# Patient Record
Sex: Male | Born: 1960 | Race: White | Hispanic: No | Marital: Married | Smoking: Never smoker
Health system: Southern US, Community
[De-identification: ages and names within clinical notes are randomized; demographics above are authoritative.]

## PROBLEM LIST (undated history)

## (undated) DIAGNOSIS — R011 Cardiac murmur, unspecified: Secondary | ICD-10-CM

## (undated) DIAGNOSIS — E079 Disorder of thyroid, unspecified: Secondary | ICD-10-CM

## (undated) HISTORY — DX: Disorder of thyroid, unspecified: E07.9

## (undated) HISTORY — PX: COLONOSCOPY: SHX174

## (undated) HISTORY — DX: Cardiac murmur, unspecified: R01.1

---

## 2002-10-19 ENCOUNTER — Emergency Department (HOSPITAL_COMMUNITY): Admission: EM | Admit: 2002-10-19 | Discharge: 2002-10-19 | Payer: Self-pay | Admitting: Emergency Medicine

## 2002-10-19 ENCOUNTER — Encounter: Payer: Self-pay | Admitting: Emergency Medicine

## 2007-01-04 ENCOUNTER — Encounter: Payer: Self-pay | Admitting: Internal Medicine

## 2007-01-12 ENCOUNTER — Ambulatory Visit: Payer: Self-pay | Admitting: Internal Medicine

## 2007-01-12 DIAGNOSIS — E041 Nontoxic single thyroid nodule: Secondary | ICD-10-CM

## 2007-01-17 LAB — CONVERTED CEMR LAB
Basophils Absolute: 0.2 10*3/uL — ABNORMAL HIGH (ref 0.0–0.1)
Basophils Relative: 2.7 % — ABNORMAL HIGH (ref 0.0–1.0)
Free T4: 0.8 ng/dL (ref 0.6–1.6)
MCHC: 34.5 g/dL (ref 30.0–36.0)
Monocytes Relative: 4.5 % (ref 3.0–11.0)
Neutrophils Relative %: 53.1 % (ref 43.0–77.0)
Platelets: 217 10*3/uL (ref 150–400)
RBC: 4.46 M/uL (ref 4.22–5.81)
RDW: 12.2 % (ref 11.5–14.6)

## 2007-01-24 ENCOUNTER — Encounter: Admission: RE | Admit: 2007-01-24 | Discharge: 2007-01-24 | Payer: Self-pay | Admitting: Internal Medicine

## 2007-02-08 ENCOUNTER — Ambulatory Visit: Payer: Self-pay | Admitting: Gastroenterology

## 2007-02-09 ENCOUNTER — Telehealth (INDEPENDENT_AMBULATORY_CARE_PROVIDER_SITE_OTHER): Payer: Self-pay | Admitting: *Deleted

## 2007-02-15 ENCOUNTER — Encounter: Payer: Self-pay | Admitting: Internal Medicine

## 2007-02-15 ENCOUNTER — Encounter (INDEPENDENT_AMBULATORY_CARE_PROVIDER_SITE_OTHER): Payer: Self-pay | Admitting: Interventional Radiology

## 2007-02-15 ENCOUNTER — Other Ambulatory Visit: Admission: RE | Admit: 2007-02-15 | Discharge: 2007-02-15 | Payer: Self-pay | Admitting: Interventional Radiology

## 2007-02-15 ENCOUNTER — Encounter: Admission: RE | Admit: 2007-02-15 | Discharge: 2007-02-15 | Payer: Self-pay | Admitting: Internal Medicine

## 2007-02-21 ENCOUNTER — Telehealth (INDEPENDENT_AMBULATORY_CARE_PROVIDER_SITE_OTHER): Payer: Self-pay | Admitting: *Deleted

## 2007-02-22 ENCOUNTER — Ambulatory Visit: Payer: Self-pay | Admitting: Gastroenterology

## 2007-02-22 ENCOUNTER — Encounter: Payer: Self-pay | Admitting: Internal Medicine

## 2007-03-23 ENCOUNTER — Ambulatory Visit: Payer: Self-pay | Admitting: Internal Medicine

## 2009-07-15 ENCOUNTER — Ambulatory Visit: Payer: Self-pay | Admitting: Internal Medicine

## 2010-03-03 ENCOUNTER — Ambulatory Visit
Admission: RE | Admit: 2010-03-03 | Discharge: 2010-03-03 | Payer: Self-pay | Source: Home / Self Care | Attending: Internal Medicine | Admitting: Internal Medicine

## 2010-03-07 ENCOUNTER — Ambulatory Visit
Admission: RE | Admit: 2010-03-07 | Discharge: 2010-03-07 | Payer: Self-pay | Source: Home / Self Care | Attending: Internal Medicine | Admitting: Internal Medicine

## 2010-03-07 ENCOUNTER — Ambulatory Visit: Admit: 2010-03-07 | Payer: Self-pay | Admitting: Internal Medicine

## 2010-03-09 LAB — CONVERTED CEMR LAB
Alkaline Phosphatase: 48 units/L (ref 39–117)
Basophils Relative: 2.5 % (ref 0.0–3.0)
CO2: 30 meq/L (ref 19–32)
Calcium: 9.4 mg/dL (ref 8.4–10.5)
Chloride: 105 meq/L (ref 96–112)
Cholesterol: 201 mg/dL — ABNORMAL HIGH (ref 0–200)
Creatinine, Ser: 0.9 mg/dL (ref 0.4–1.5)
Direct LDL: 118.6 mg/dL
Eosinophils Absolute: 0.2 10*3/uL (ref 0.0–0.7)
Eosinophils Relative: 4.4 % (ref 0.0–5.0)
GFR calc non Af Amer: 95.02 mL/min (ref >60)
Glucose, Bld: 78 mg/dL (ref 70–99)
HCT: 39.1 % (ref 39.0–52.0)
HDL: 46.5 mg/dL (ref >39.00)
Hemoglobin: 13.6 g/dL (ref 13.0–17.0)
Lymphs Abs: 2 10*3/uL (ref 0.7–4.0)
Neutro Abs: 1.1 10*3/uL — ABNORMAL LOW (ref 1.4–7.7)
Potassium: 4.1 meq/L (ref 3.5–5.1)
RBC: 4.34 M/uL (ref 4.22–5.81)
Sodium: 142 meq/L (ref 135–145)
TSH: 0.67 microintl units/mL (ref 0.35–5.50)
Total Protein: 6.6 g/dL (ref 6.0–8.3)
VLDL: 34 mg/dL (ref 0.0–40.0)

## 2010-03-11 NOTE — Assessment & Plan Note (Signed)
Summary: CPX  AND FASTING LABS??///SPH   Vital Signs:  Patient profile:   50 year old male Height:      72 inches Weight:      217 pounds BMI:     29.54 Temp:     98.5 degrees F oral Pulse rate:   72 / minute Resp:     16 per minute BP sitting:   120 / 80  (left arm)  Vitals Entered By: Jeremy Johann CMA (July 15, 2009 2:55 PM) CC: cpx Comments --fasting  REVIEWED MED LIST, PATIENT AGREED DOSE AND INSTRUCTION CORRECT    History of Present Illness: CPX Feeling well  Allergies: 1)  ! Pcn  Past History:  Past Medical History: thyroid  biopsy 02-2007, benign goiter, no further workup  Family History: DM - M. Family Colon Ca - M  (dx age 43) prostate ca-- uncle (dx in his 57s) MI-- uncles    Social History: Married 3 kids works for [employer]-- no ETOH-- socially diet-- improving, lost several pounds lately Exercise-- better lately   Review of Systems CV:  when asked, reports occasional "funny feeling in the chest" for the last 2 years; pain is anteriorly, with or without exertion, no radiation, no associated with nausea diaphoresis. Last a few seconds. Resp:  Denies cough and shortness of breath. GI:  Denies bloody stools, nausea, and vomiting. GU:  Denies dysuria, hematuria, urinary frequency, and urinary hesitancy.  Physical Exam  General:  alert, well-developed, and well-nourished.   Neck:  no thyromegaly.   Lungs:  normal respiratory effort, no intercostal retractions, no accessory muscle use, and normal breath sounds.   Heart:  normal rate, regular rhythm, no murmur, and no gallop.   Abdomen:  soft, non-tender, no distention, no masses, no guarding, and no rigidity.   Rectal:  No external abnormalities noted. Normal sphincter tone. No rectal masses or tenderness. Prostate:  Prostate gland firm and smooth, no enlargement, nodularity, tenderness, mass, asymmetry or induration. Extremities:  no edema Psych:  Cognition and judgment appear intact.  Alert and cooperative with normal attention span and concentration.  not anxious appearing and not depressed appearing.     Impression & Recommendations:  Problem # 1:  HEALTH MAINTENANCE EXAM (ICD-V70.0) Td 04 Family history of colon cancer ( mother) patient had a colonoscopy 1-09:  negative, next 1-14 Has a healthy lifestyle, has been losing weight ----> praised  has an uncle w/ prostate cancer, check a PSA Has pain atypical chest pain, EKG showed sinus bradycardia, no acute changes Recommend to call me if the chest pain is different, more intense; otherwise observe  Orders: Venipuncture (21308) TLB-BMP (Basic Metabolic Panel-BMET) (80048-METABOL) TLB-CBC Platelet - w/Differential (85025-CBCD) TLB-Lipid Panel (80061-LIPID) TLB-TSH (Thyroid Stimulating Hormone) (84443-TSH) TLB-Hepatic/Liver Function Pnl (80076-HEPATIC) TLB-PSA (Prostate Specific Antigen) (84153-PSA) EKG w/ Interpretation (93000)  Patient Instructions: 1)  Please schedule a follow-up appointment in 1 year.

## 2010-03-13 NOTE — Assessment & Plan Note (Signed)
Summary: remove stitches/drb   Nurse Visit  CC: Stitch removal./kb Comments Wound was clean and healed nicely so the remaining stitches were removed. Lucious Groves CMA  March 07, 2010 8:41 AM    Allergies: 1)  ! Pcn

## 2010-03-13 NOTE — Assessment & Plan Note (Signed)
Summary: remove stitches//ph   Vital Signs:  Patient profile:   50 year old male Weight:      229 pounds Pulse rate:   76 / minute Pulse rhythm:   regular BP sitting:   122 / 80  (left arm) Cuff size:   large  Vitals Entered By: Army Fossa CMA (March 03, 2010 3:41 PM) CC: Remove stitches in thumb Comments they have been in for 1 week [facility] [address]   History of Present Illness: status post laceration repair at the dorsum of the right thumb,   proximally.   Current Medications (verified): 1)  None  Allergies (verified): 1)  ! Pcn  Past History:  Past Medical History: Reviewed history from 07/15/2009 and no changes required. thyroid  biopsy 02-2007, benign goiter, no further workup  Past Surgical History: Reviewed history from 03/23/2007 and no changes required. no  Social History: Reviewed history from 07/15/2009 and no changes required. Married 3 kids works for [employer]-- no ETOH-- socially diet-- improving, lost several pounds lately Exercise-- better lately   Physical Exam  General:  alert and well-developed.   Extremities:  wound--no redness, no discharge. Moving his hands and fingers without problems   Impression & Recommendations:  Problem # 1:  LACERATION, HAND (ICD-882.0) 3 stitches removed, I noticed that the wound was  not completely healed. I left the most proximal 3 stitches, he will come back in 4 days to complete the removal.   Orders Added: 1)  Est. Patient Level II [16109]

## 2010-04-15 ENCOUNTER — Encounter: Payer: Self-pay | Admitting: Internal Medicine

## 2010-04-15 ENCOUNTER — Other Ambulatory Visit: Payer: Self-pay | Admitting: Internal Medicine

## 2010-04-15 ENCOUNTER — Ambulatory Visit (INDEPENDENT_AMBULATORY_CARE_PROVIDER_SITE_OTHER): Payer: BC Managed Care – PPO | Admitting: Internal Medicine

## 2010-04-15 DIAGNOSIS — R5381 Other malaise: Secondary | ICD-10-CM

## 2010-04-15 DIAGNOSIS — R6882 Decreased libido: Secondary | ICD-10-CM | POA: Insufficient documentation

## 2010-04-15 DIAGNOSIS — R5383 Other fatigue: Secondary | ICD-10-CM

## 2010-04-15 LAB — GLUCOSE, RANDOM: Glucose, Bld: 87 mg/dL (ref 70–99)

## 2010-04-15 LAB — CONVERTED CEMR LAB
Sex Hormone Binding: 21 nmol/L (ref 13–71)
Testosterone Free: 65.8 pg/mL (ref 47.0–244.0)
Testosterone-% Free: 2.5 % (ref 1.6–2.9)

## 2010-04-22 NOTE — Assessment & Plan Note (Signed)
Summary: tired, personal issues per wife ///sph   Vital Signs:  Patient profile:   50 year old male Height:      72 inches Weight:      231.25 pounds BMI:     31.48 Pulse rate:   72 / minute Pulse rhythm:   regular BP sitting:   126 / 80  (left arm) Cuff size:   large  Vitals Entered By: Army Fossa CMA (April 15, 2010 9:17 AM) CC: Pt here to discuss having low energy levels Comments not fasting  [facility] [address]   History of Present Illness:  for the last year has noted that his energy level is decreased, his libido has also decreased.  wonders about low testosterone   ROS Admits  to more stress at work, occasionally feels overwhelmed. No depression. Appetite is somehow increased, he thinks related to stress He has gained a few pounds , they need to shave has not decrease, no ED per se  He is sleeping okay . Denies any chest pain or shortness of breath, no edema ,  occasional palpitations usually with stress No nausea, vomiting, bloating the stools  Current Medications (verified): 1)  None  Allergies (verified): 1)  ! Pcn  Past History:  Past Medical History: Reviewed history from 07/15/2009 and no changes required. thyroid  biopsy 02-2007, benign goiter, no further workup  Past Surgical History: Reviewed history from 03/23/2007 and no changes required. no  Social History: Married 3 kids works for [employer]-- no ETOH-- socially    Physical Exam  General:  alert and well-developed.   Lungs:  normal respiratory effort, no intercostal retractions, no accessory muscle use, and normal breath sounds.   Heart:  normal rate, regular rhythm, no murmur, and no gallop.   Genitalia:  circumcised, no hydrocele, no varicocele, no scrotal masses, no testicular masses or atrophy, no cutaneous lesions, and no urethral discharge.   Extremities:  no pretibial edema bilaterally  Psych:  not anxious appearing and not depressed appearing.     Impression &  Recommendations:  Problem # 1:  FATIGUE (ICD-780.79) one-year history of slightly decreased energy level and libido. Review of systems negative except for increase in stress at work. Will check basic labs to rule out anemia, diabetes and thyroid disease. I also  think is reasonable to check a testosterone level.   if all the labs are normal I will recommend  to try to manage his stress better, counseled about this.  Orders: Venipuncture (65784) TLB-Glucose, QUANT (82947-GLU) TLB-Hemoglobin (Hgb) (85018-HGB) TLB-TSH (Thyroid Stimulating Hormone) (84443-TSH) Specimen Handling (69629) T- * Misc. Laboratory test [phone])  Problem # 2:  LIBIDO, DECREASED (ICD-799.81)  see #1  Orders: T- * Misc. Laboratory test [phone])   Orders Added: 1)  Venipuncture [36415] 2)  TLB-Glucose, QUANT [82947-GLU] 3)  TLB-Hemoglobin (Hgb) [85018-HGB] 4)  TLB-TSH (Thyroid Stimulating Hormone) [84443-TSH] 5)  Specimen Handling [99000] 6)  T- * Misc. Laboratory test [99999] 7)  Est. Patient Level III [10272]

## 2010-10-13 ENCOUNTER — Emergency Department (INDEPENDENT_AMBULATORY_CARE_PROVIDER_SITE_OTHER): Payer: BC Managed Care – PPO

## 2010-10-13 ENCOUNTER — Emergency Department (HOSPITAL_BASED_OUTPATIENT_CLINIC_OR_DEPARTMENT_OTHER)
Admission: EM | Admit: 2010-10-13 | Discharge: 2010-10-13 | Disposition: A | Discharge status: Home / Self Care | Payer: BC Managed Care – PPO | Attending: Emergency Medicine | Admitting: Emergency Medicine

## 2010-10-13 ENCOUNTER — Encounter: Payer: Self-pay | Admitting: *Deleted

## 2010-10-13 DIAGNOSIS — S92009A Unspecified fracture of unspecified calcaneus, initial encounter for closed fracture: Secondary | ICD-10-CM | POA: Insufficient documentation

## 2010-10-13 DIAGNOSIS — M79609 Pain in unspecified limb: Secondary | ICD-10-CM

## 2010-10-13 DIAGNOSIS — S92213A Displaced fracture of cuboid bone of unspecified foot, initial encounter for closed fracture: Secondary | ICD-10-CM | POA: Insufficient documentation

## 2010-10-13 DIAGNOSIS — X500XXA Overexertion from strenuous movement or load, initial encounter: Secondary | ICD-10-CM

## 2010-10-13 DIAGNOSIS — S92211A Displaced fracture of cuboid bone of right foot, initial encounter for closed fracture: Secondary | ICD-10-CM

## 2010-10-13 DIAGNOSIS — R209 Unspecified disturbances of skin sensation: Secondary | ICD-10-CM | POA: Insufficient documentation

## 2010-10-13 MED ORDER — OXYCODONE-ACETAMINOPHEN 5-325 MG PO TABS: 2.0000 | ORAL_TABLET | ORAL | Status: AC | PRN

## 2010-10-13 NOTE — ED Notes (Signed)
Pt returned from CT °

## 2010-10-13 NOTE — ED Notes (Signed)
Right foot inj today while cutting a tree down. Swelling and bruising.

## 2010-10-13 NOTE — ED Notes (Signed)
Pt and family informed of plan of care.  Awaiting CT.  Pt denies pain at present time.

## 2010-10-13 NOTE — ED Provider Notes (Signed)
History     CSN: [id number] Arrival date & time: 10/13/2010  5:59 PM  Chief Complaint  Patient presents with  . Foot Injury   HPI Comments: Pt states that he stepped on a log and rolled his ankle  Patient is a 50 y.o. male presenting with foot injury. The history is provided by the patient. No language interpreter was used.  Foot Injury  The incident occurred 3 to 5 hours ago. The incident occurred in the yard. Injury mechanism: twisting injury. The pain is present in the right foot. The quality of the pain is described as throbbing. The pain is moderate. The pain has been constant since onset. Associated symptoms include tingling. Pertinent negatives include no numbness, no loss of motion and no loss of sensation. He reports no foreign bodies present. The symptoms are aggravated by bearing weight and palpation. He has tried NSAIDs for the symptoms. The treatment provided mild relief.    History reviewed. No pertinent past medical history.  History reviewed. No pertinent past surgical history.  No family history on file.  History  Substance Use Topics  . Smoking status: Never Smoker   . Smokeless tobacco: Not on file  . Alcohol Use: No      Review of Systems  Neurological: Positive for tingling. Negative for numbness.  All other systems reviewed and are negative.    Physical Exam  BP 124/68  Pulse 72  Temp(Src) 97.3 F (36.3 C) (Oral)  Resp 20  SpO2 99%  Physical Exam  Nursing note and vitals reviewed. Constitutional: He is oriented to person, place, and time. He appears well-developed and well-nourished.  HENT:  Head: Normocephalic.  Cardiovascular: Normal rate and regular rhythm.   Musculoskeletal: He exhibits tenderness.       Pt has swelling and tenderness noted to the right lateral foot and along the fifth metatarsal  Neurological: He is alert and oriented to person, place, and time.  Skin:       Bruising noted to the right foot    ED Course   Procedures Dg Foot Complete Right  10/13/2010  *RADIOLOGY REPORT*  Clinical Data: Injury.  Posterior foot pain after stepping off a log.  RIGHT FOOT COMPLETE - 3+ VIEW  Comparison: None.  Findings: There is a small bone density seen lateral to the anterior aspect of the calcaneus.  Additionally, there is a fracture of the cuboid which may be comminuted.  Further evaluation with CT of the foot without contrast may be helpful in delineating the extent of fractures.  No other fractures identified on these plain films.  IMPRESSION: Fractures of the anterior aspect of the calcaneus and cuboid. Further evaluation CT may be helpful.  See above.  Original Report Authenticated By: Patterson Hammersmith, M.D.     MDM Spoke with Dr. Luiz Blare who said get the ct scan and put non weight bearing in posterior splint and he can be seen      Teressa Lower, NP 10/13/10 1952

## 2010-10-13 NOTE — ED Provider Notes (Signed)
Medical screening examination/treatment/procedure(s) were performed by non-physician practitioner and as supervising physician I was immediately available for consultation/collaboration.   Marcianne Ozbun, MD 10/13/10 2136 

## 2010-10-13 NOTE — ED Notes (Signed)
Report given to Mary, RN.

## 2012-02-22 ENCOUNTER — Encounter: Payer: Self-pay | Admitting: Gastroenterology

## 2012-02-28 IMAGING — CT CT FOOT*R* W/O CM
3 of 6 series · 16 of 33 positions shown, 18 images · non-contrast
Comparison: Radiographs 10/13/2010.

CLINICAL DATA: Evaluate for fractures.

CT OF THE RIGHT FOOT WITHOUT CONTRAST
TECHNIQUE: Multidetector CT imaging was performed according to the
standard protocol. Multiplanar CT image reconstructions were also
generated.

[Series 4: foot/ankle 2.0 b31s · axial · 0.43mm/px · z∈[-282,-66]mm · 8 of 130 slices shown, 10 images]
[im 11/130  soft-tissue]
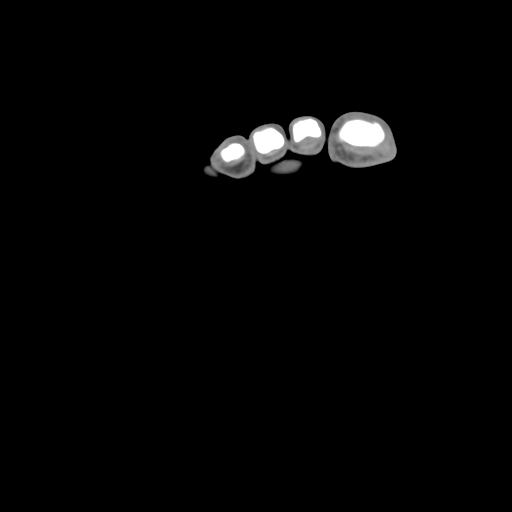
[im 11/130  bone]
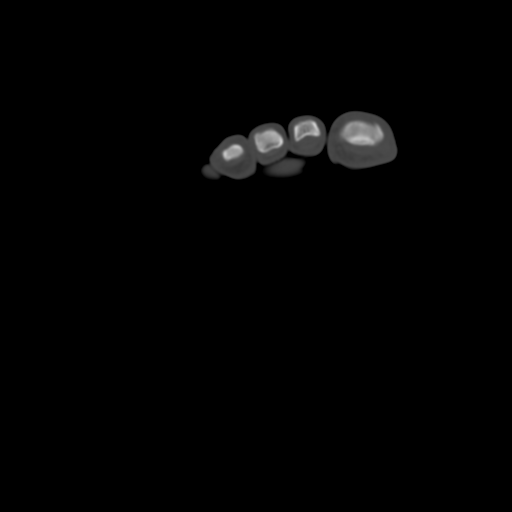
[im 33/130  bone]
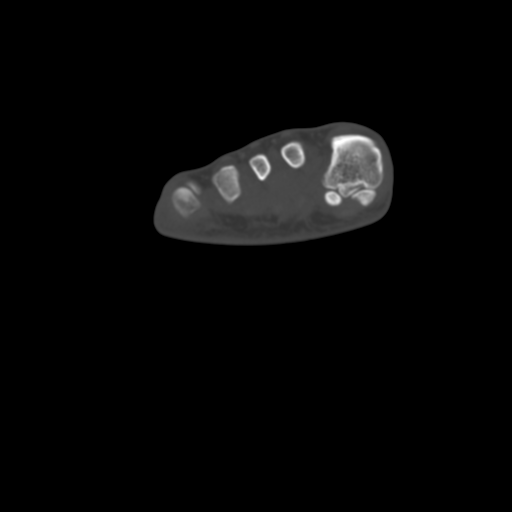
[im 44/130  bone]
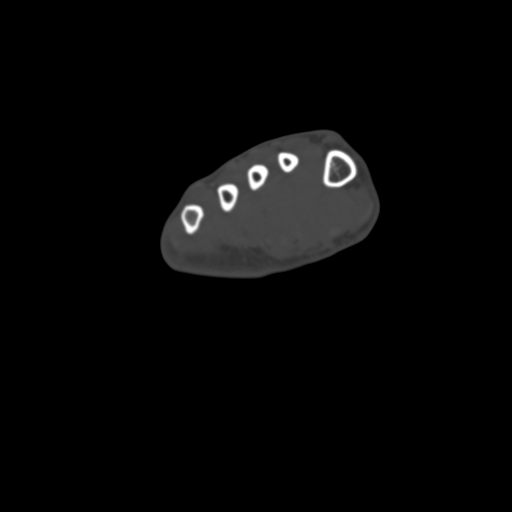
[im 54/130  bone]
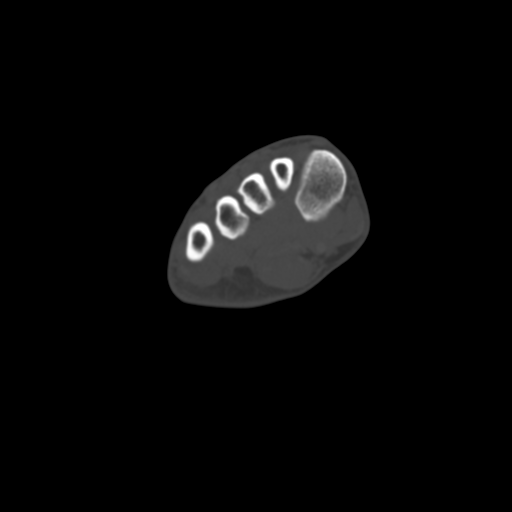
[im 76/130  soft-tissue]
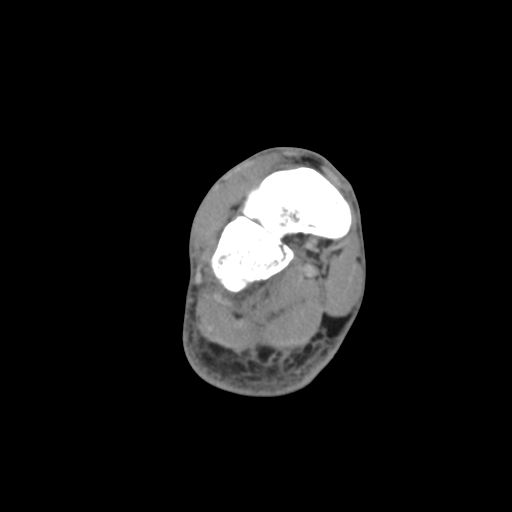
[im 76/130  bone]
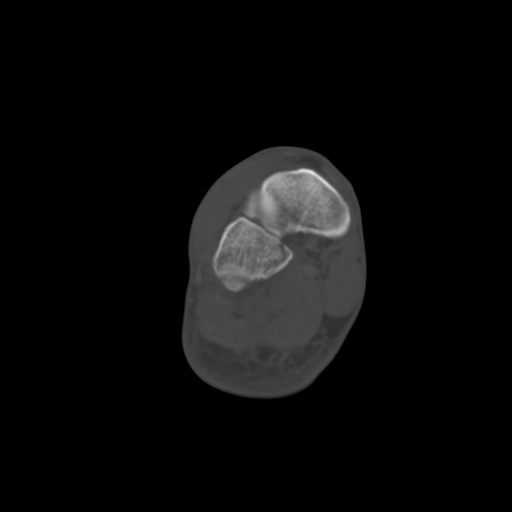
[im 87/130  bone]
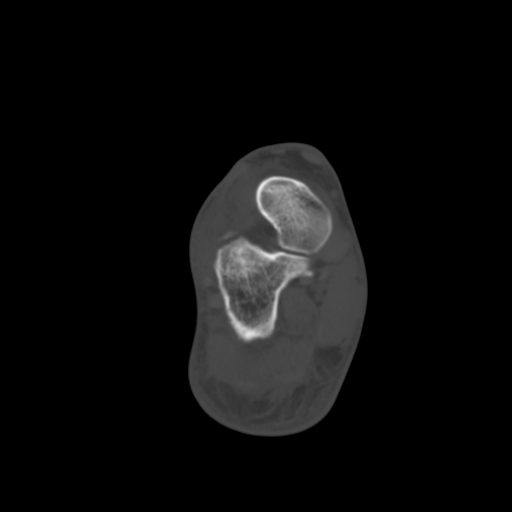
[im 97/130  bone]
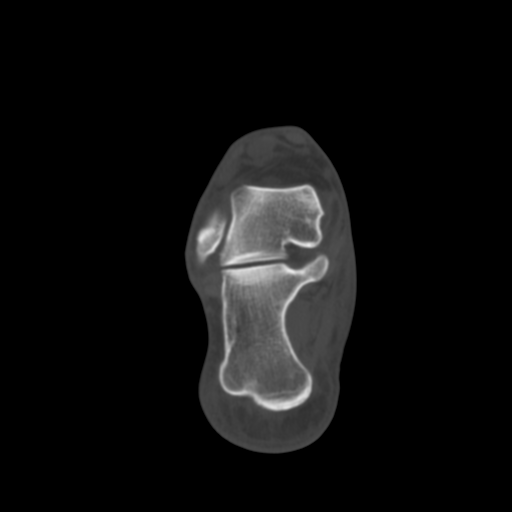
[im 119/130  bone]
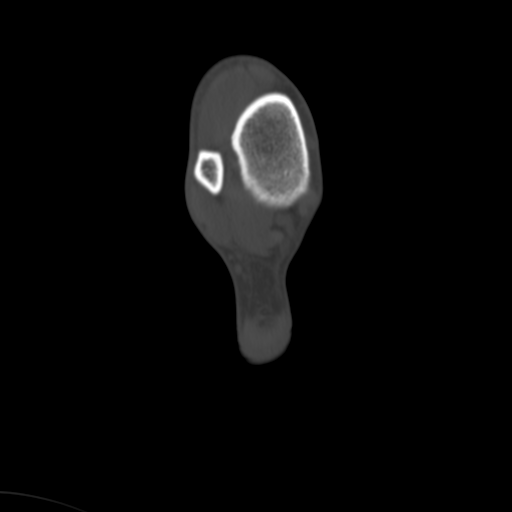

[Series 5: foot/ankle 2.0 coronal · coronal · 0.52mm/px · 3 of 83 slices shown]
[im 21/83  bone]
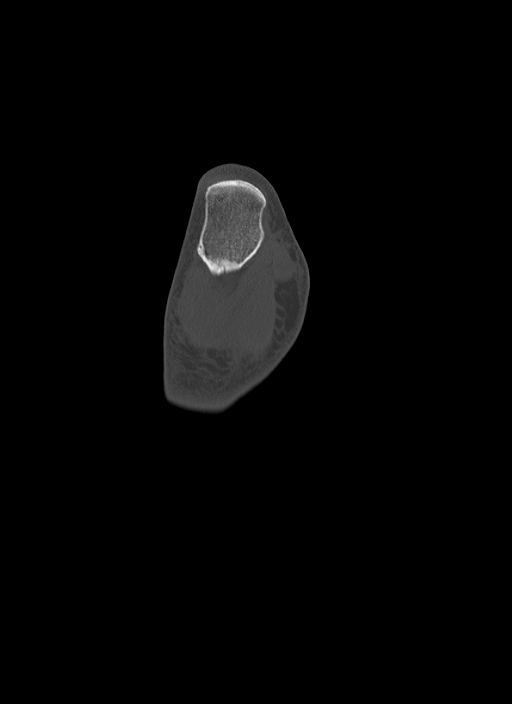
[im 42/83  bone]
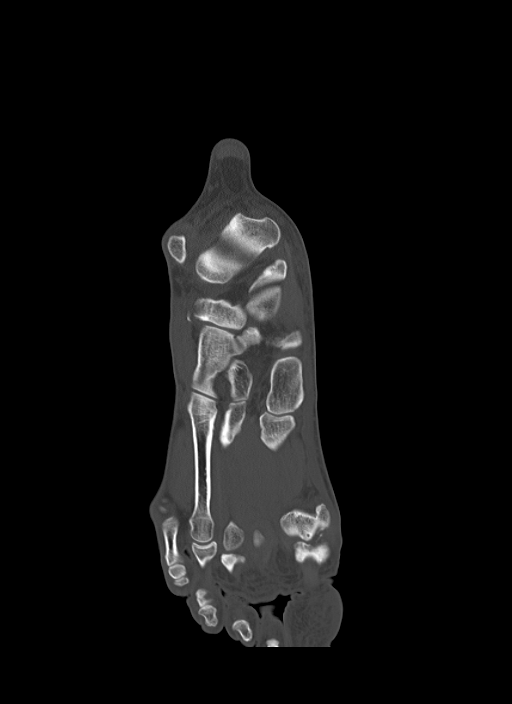
[im 62/83  bone]
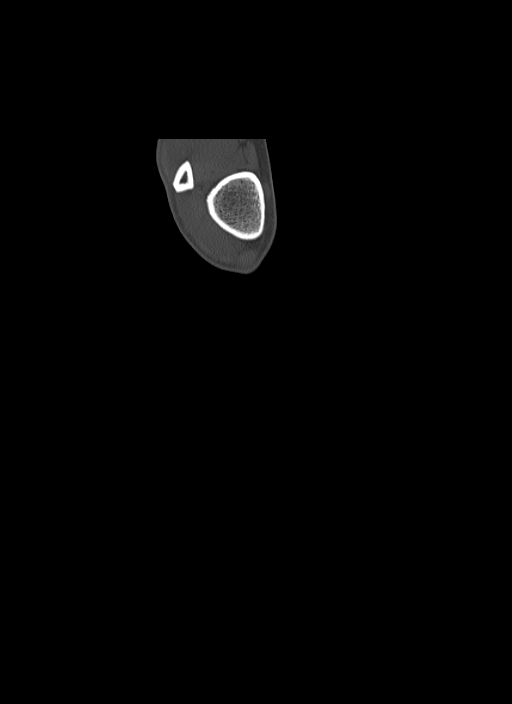

[Series 6: foot/ankle 2.0 sagittal · sagittal · 0.70mm/px · 5 of 52 slices shown]
[im 8/52  bone]
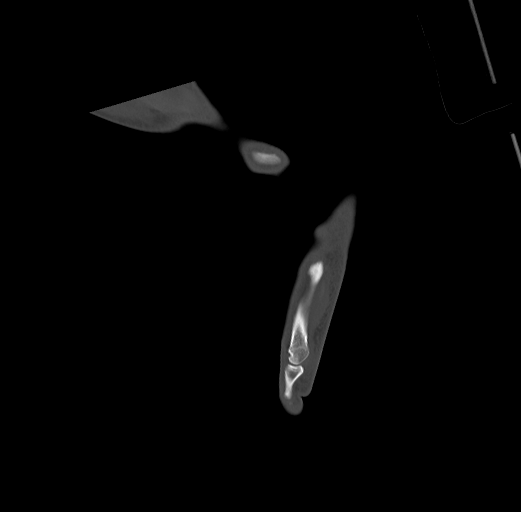
[im 15/52  bone]
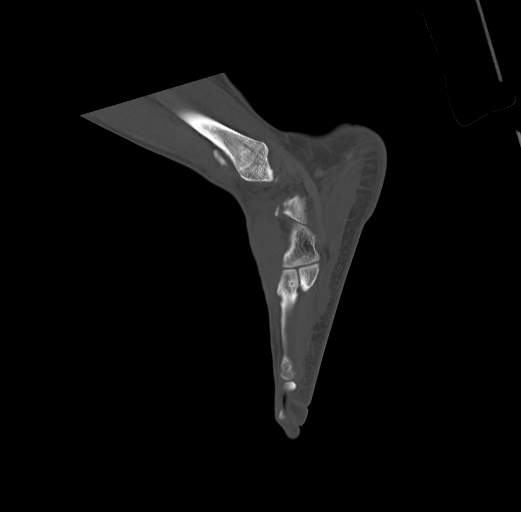
[im 22/52  bone]
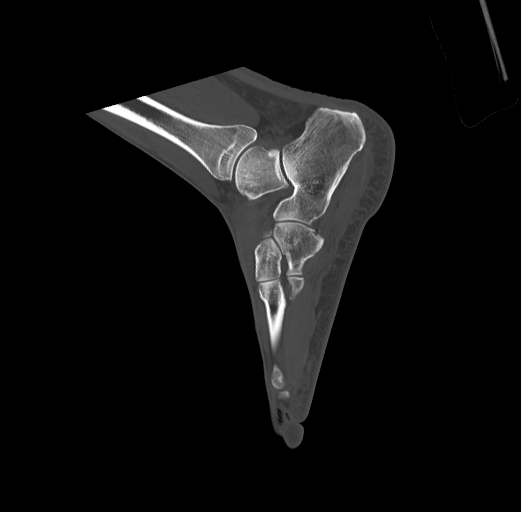
[im 30/52  bone]
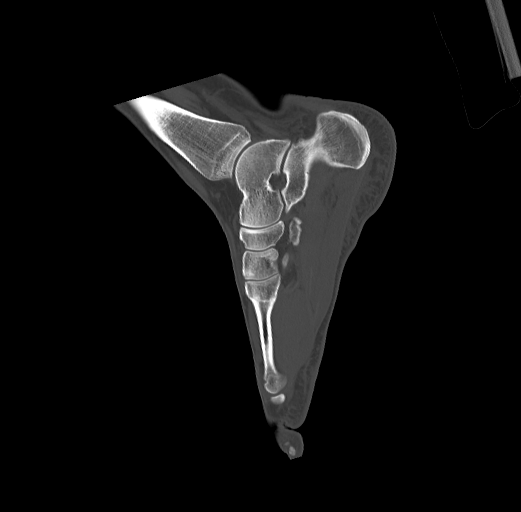
[im 37/52  bone]
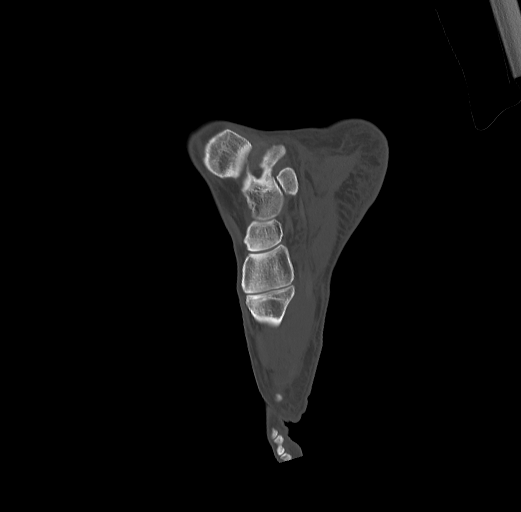

[16 of 33 positions shown; findings below may reference images not displayed]

FINDINGS: There is a small erosion fracture off the lateral aspect
of the distal calcaneus near the calcaneal cuboid joint.  This also
a nondisplaced fracture involving the anterior process of the
calcaneus.  There is a comminuted but nondisplaced fracture
involving the cuboid.  This mainly involves the medial cuboid and
does involve the calcaneal cuboid joint.  The [DATE] forearms and
navicular bone are intact.  The tarsometatarsal joints are
maintained.  No evidence of Lisfranc's injury.

There are degenerative changes at the first metatarsal phalangeal
joint.  The other joint spaces are maintained.  The tibiotalar
joint is normal.  The subtalar joints are intact.
IMPRESSION: 1.  Small lateral avulsion fracture involving the calcaneus along
with an anterior process fracture.
2.  Comminuted but nondisplaced fractures involving the cuboid.

## 2012-02-28 IMAGING — CR DG FOOT COMPLETE 3+V*R*
3 series · 3 of 3 positions shown · non-contrast
Comparison: None.

CLINICAL DATA: Injury.  Posterior foot pain after stepping off a
log.

RIGHT FOOT COMPLETE - 3+ VIEW

[t foot ap right]
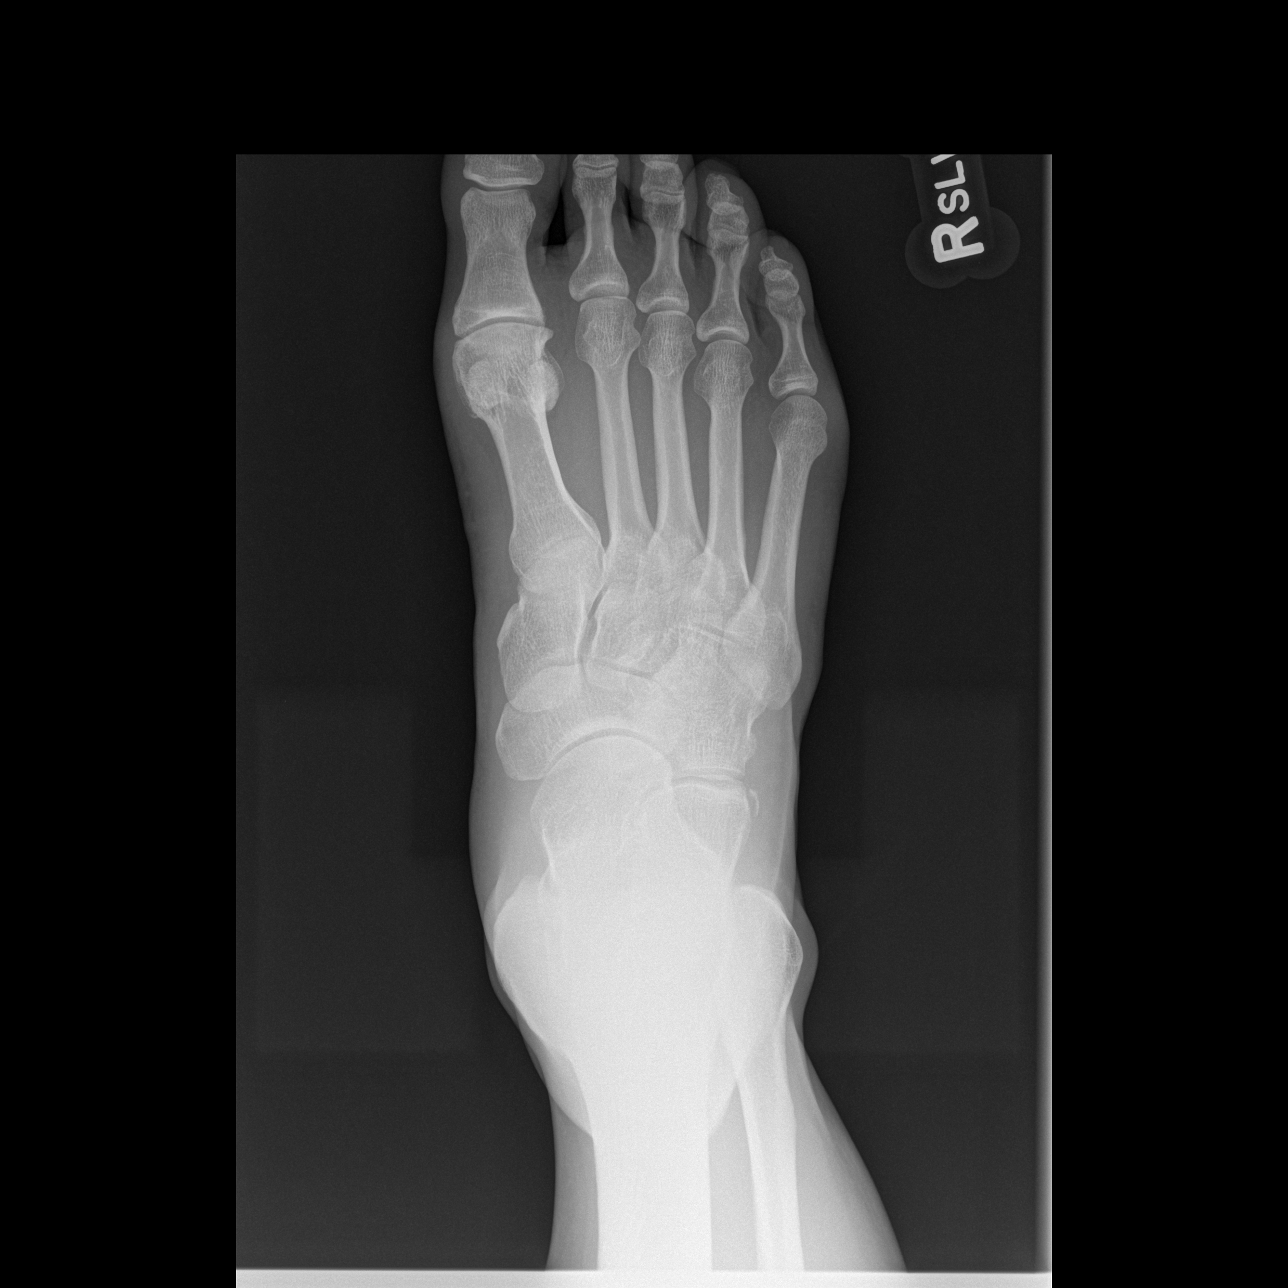

[t foot oblique right]
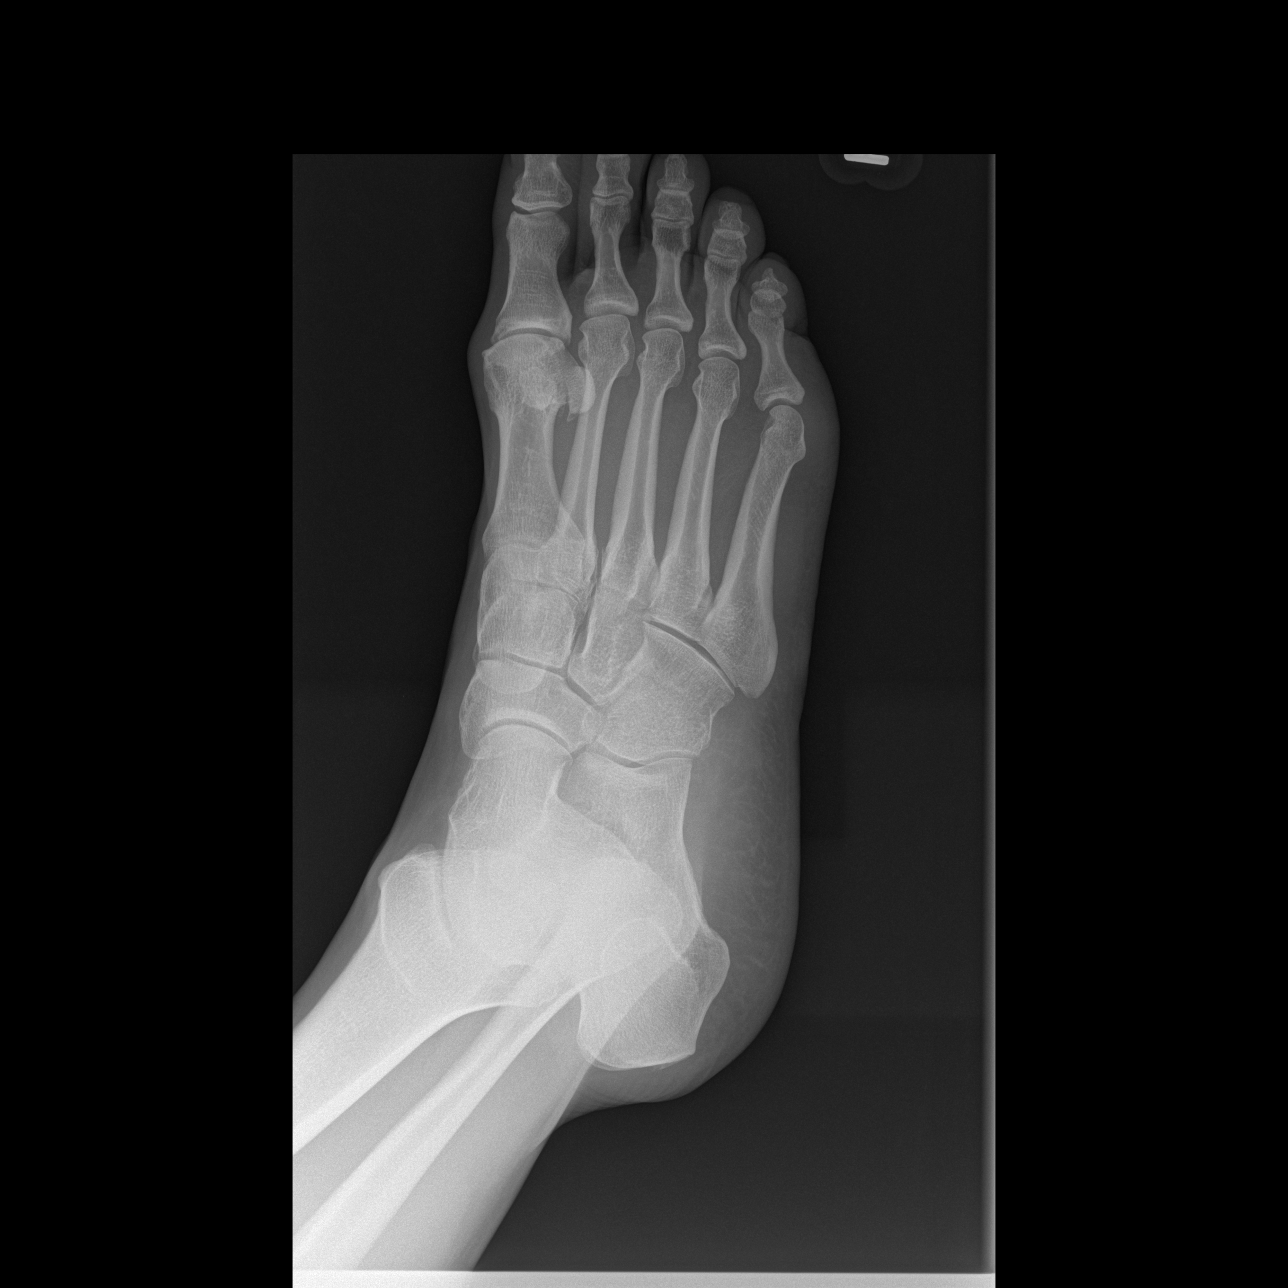

[t foot lat right]
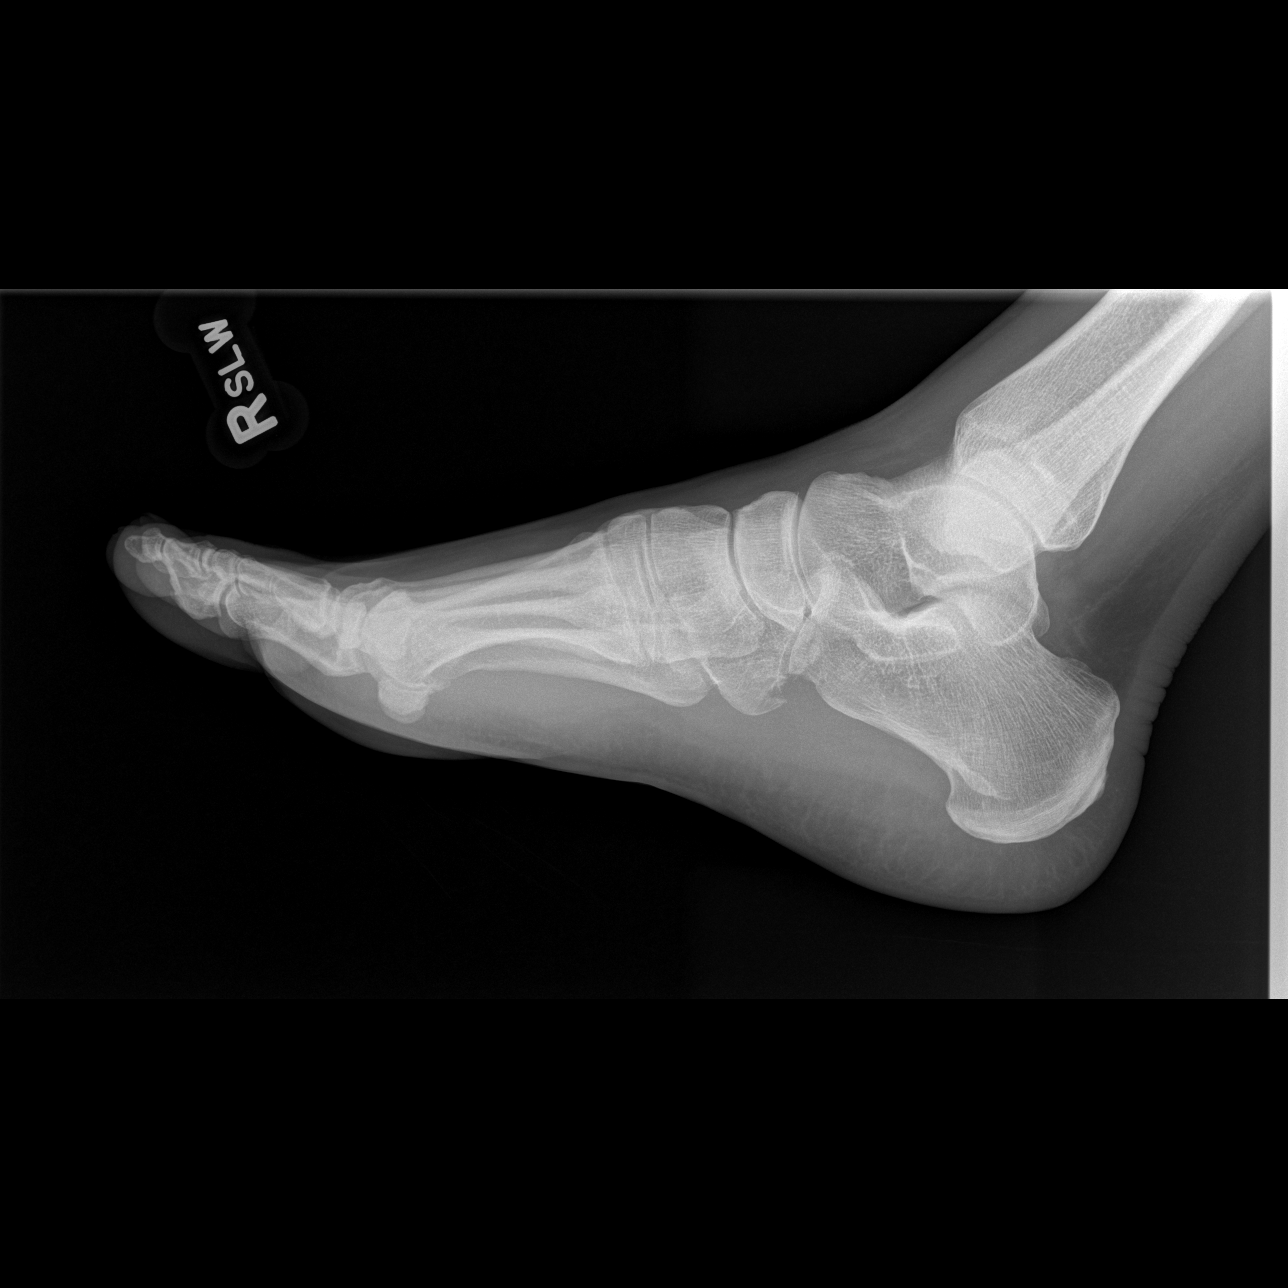

[3 of 3 positions shown; findings below may reference images not displayed]

FINDINGS: There is a small bone density seen lateral to the
anterior aspect of the calcaneus.  Additionally, there is a
fracture of the cuboid which may be comminuted.  Further evaluation
with CT of the foot without contrast may be helpful in delineating
the extent of fractures.  No other fractures identified on these
plain films.
IMPRESSION: Fractures of the anterior aspect of the calcaneus and cuboid.
Further evaluation CT may be helpful.  See above.

## 2012-08-05 ENCOUNTER — Encounter: Payer: Self-pay | Admitting: Gastroenterology

## 2014-02-19 ENCOUNTER — Encounter: Payer: Self-pay | Admitting: Gastroenterology

## 2015-03-25 ENCOUNTER — Encounter: Payer: Self-pay | Admitting: Gastroenterology

## 2015-04-24 ENCOUNTER — Ambulatory Visit (AMBULATORY_SURGERY_CENTER): Payer: Self-pay | Admitting: *Deleted

## 2015-04-24 VITALS — Ht 72.0 in | Wt 228.8 lb

## 2015-04-24 DIAGNOSIS — Z800 Family history of malignant neoplasm of digestive organs: Secondary | ICD-10-CM

## 2015-04-24 NOTE — Progress Notes (Signed)
No egg or soy allergy known to patient  No issues with past sedation with any surgeries  or procedures, no intubation problems  No diet pills per patient No home 02 use per patient  No blood thinners per patient  Pt denies issues with constipation  emmi declined

## 2015-05-08 ENCOUNTER — Encounter: Payer: Self-pay | Admitting: Gastroenterology

## 2015-05-08 ENCOUNTER — Ambulatory Visit (AMBULATORY_SURGERY_CENTER): Payer: BLUE CROSS/BLUE SHIELD | Admitting: Gastroenterology

## 2015-05-08 VITALS — BP 103/52 | HR 53 | Temp 98.0°F | Resp 21 | Ht 72.0 in | Wt 228.0 lb

## 2015-05-08 DIAGNOSIS — Z800 Family history of malignant neoplasm of digestive organs: Secondary | ICD-10-CM | POA: Diagnosis not present

## 2015-05-08 DIAGNOSIS — Z1211 Encounter for screening for malignant neoplasm of colon: Secondary | ICD-10-CM

## 2015-05-08 DIAGNOSIS — D125 Benign neoplasm of sigmoid colon: Secondary | ICD-10-CM

## 2015-05-08 MED ORDER — SODIUM CHLORIDE 0.9 % IV SOLN: 500.0000 mL | INTRAVENOUS | Status: DC

## 2015-05-08 NOTE — Progress Notes (Signed)
No problems noted in the recovery room. maw 

## 2015-05-08 NOTE — Op Note (Addendum)
[place] Patient Name: Greg Pace Procedure Date: 05/08/2015 10:39 AM MRN: [id number] Endoscopist: Remo Lipps P. Havery Moros , MD Age: 55 Referring MD:  Date of Birth: [birth year] Gender: Male Procedure:                Colonoscopy Indications:              Screening patient at increased risk: Family history                            of 1st-degree relative with colorectal cancer at                            age 61 years (or older) Medicines:                Monitored Anesthesia Care Procedure:                Pre-Anesthesia Assessment:                           - Prior to the procedure, a History and Physical                            was performed, and patient medications and                            allergies were reviewed. The patient's tolerance of                            previous anesthesia was also reviewed. The risks                            and benefits of the procedure and the sedation                            options and risks were discussed with the patient.                            All questions were answered, and informed consent                            was obtained. Prior Anticoagulants: The patient has                            taken no previous anticoagulant or antiplatelet                            agents. ASA Grade Assessment: II - A patient with                            mild systemic disease. After reviewing the risks                            and benefits, the patient was deemed in  satisfactory condition to undergo the procedure.                           After obtaining informed consent, the colonoscope                            was passed under direct vision. Throughout the                            procedure, the patient's blood pressure, pulse, and                            oxygen saturations were monitored continuously. The                            Model CF-HQ190L [id number]) scope was introduced                            through the anus and advanced to the the cecum,                            identified by appendiceal orifice and ileocecal                            valve. The colonoscopy was performed without                            difficulty. The patient tolerated the procedure                            well. The quality of the bowel preparation was                            adequate. The ileocecal valve, appendiceal orifice,                            and rectum were photographed. Scope In: 10:47:15 AM Scope Out: 11:04:31 AM Scope Withdrawal Time: 0 hours 14 minutes 46 seconds  Total Procedure Duration: 0 hours 17 minutes 16 seconds  Findings:      The perianal and digital rectal examinations were normal.      A 4 mm polyp was found in the sigmoid colon. The polyp was sessile. The       polyp was removed with a cold snare. Resection and retrieval were       complete.      Non-bleeding internal hemorrhoids were found during retroflexion. The       hemorrhoids were small.      The exam was otherwise without abnormality. Complications:            No immediate complications. Estimated blood loss:                            Minimal. Estimated Blood Loss:     Estimated blood loss was minimal. Impression:               - One  4 mm polyp in the sigmoid colon, removed with                            a cold snare. Resected and retrieved.                           - Non-bleeding internal hemorrhoids.                           - The examination was otherwise normal. Recommendation:           - Patient has a contact number available for                            emergencies. The signs and symptoms of potential                            delayed complications were discussed with the                            patient. Return to normal activities tomorrow.                            Written discharge instructions were provided to the                            patient.                            - Resume previous diet.                           - Continue present medications.                           - No aspirin, ibuprofen, naproxen, or other                            non-steroidal anti-inflammatory drugs for 2 weeks                            after polyp removal.                           - Await pathology results.                           - Repeat colonoscopy is recommended for                            surveillance. The colonoscopy date will be                            determined after pathology results from today's                            exam become available  for review. Remo Lipps P. Armbruster, MD 05/08/2015 11:07:38 AM This report has been signed electronically. Number of Addenda: 0 Referring MD:      Hartford Poli, MD

## 2015-05-08 NOTE — Patient Instructions (Signed)
YOU HAD AN ENDOSCOPIC PROCEDURE TODAY AT [facility]:   Refer to the procedure report that was given to you for any specific questions about what was found during the examination.  If the procedure report does not answer your questions, please call your gastroenterologist to clarify.  If you requested that your care partner not be given the details of your procedure findings, then the procedure report has been included in a sealed envelope for you to review at your convenience later.  YOU SHOULD EXPECT: Some feelings of bloating in the abdomen. Passage of more gas than usual.  Walking can help get rid of the air that was put into your GI tract during the procedure and reduce the bloating. If you had a lower endoscopy (such as a colonoscopy or flexible sigmoidoscopy) you may notice spotting of blood in your stool or on the toilet paper. If you underwent a bowel prep for your procedure, you may not have a normal bowel movement for a few days.  Please Note:  You might notice some irritation and congestion in your nose or some drainage.  This is from the oxygen used during your procedure.  There is no need for concern and it should clear up in a day or so.  SYMPTOMS TO REPORT IMMEDIATELY:   Following lower endoscopy (colonoscopy or flexible sigmoidoscopy):  Excessive amounts of blood in the stool  Significant tenderness or worsening of abdominal pains  Swelling of the abdomen that is new, acute  Fever of 100F or higher   For urgent or emergent issues, a gastroenterologist can be reached at any hour by calling [phone].   DIET: Your first meal following the procedure should be a small meal and then it is ok to progress to your normal diet. Heavy or fried foods are harder to digest and may make you feel nauseous or bloated.  Likewise, meals heavy in dairy and vegetables can increase bloating.  Drink plenty of fluids but you should avoid alcoholic beverages for 24  hours.  ACTIVITY:  You should plan to take it easy for the rest of today and you should NOT DRIVE or use heavy machinery until tomorrow (because of the sedation medicines used during the test).    FOLLOW UP: Our staff will call the number listed on your records the next business day following your procedure to check on you and address any questions or concerns that you may have regarding the information given to you following your procedure. If we do not reach you, we will leave a message.  However, if you are feeling well and you are not experiencing any problems, there is no need to return our call.  We will assume that you have returned to your regular daily activities without incident.  If any biopsies were taken you will be contacted by phone or by letter within the next 1-3 weeks.  Please call us at [phone] if you have not heard about the biopsies in 3 weeks.    SIGNATURES/CONFIDENTIALITY: You and/or your care partner have signed paperwork which will be entered into your electronic medical record.  These signatures attest to the fact that that the information above on your After Visit Summary has been reviewed and is understood.  Full responsibility of the confidentiality of this discharge information lies with you and/or your care-partner.    Handouts were given to your care partner on polyps and hemorrhoids. Please hold aspirin, aspirin products and any antiinflammatory meds for 2  weeks. You may resume your other current medications today. Await biopsy results. Please call if any questions or concerns.

## 2015-05-08 NOTE — Progress Notes (Signed)
Called to room to assist during endoscopic procedure.  Patient ID and intended procedure confirmed with present staff. Received instructions for my participation in the procedure from the performing physician.  

## 2015-05-09 ENCOUNTER — Telehealth: Payer: Self-pay

## 2015-05-09 NOTE — Telephone Encounter (Signed)
  Follow up Call-  Call back number 05/08/2015  Post procedure Call Back phone  # [phone]  Permission to leave phone message Yes     Patient questions:  Do you have a fever, pain , or abdominal swelling? No. Pain Score  0 *  Have you tolerated food without any problems? Yes.    Have you been able to return to your normal activities? Yes.    Do you have any questions about your discharge instructions: Diet   No. Medications  No. Follow up visit  No.  Do you have questions or concerns about your Care? No.  Actions: * If pain score is 4 or above: No action needed, pain <4.

## 2015-05-14 ENCOUNTER — Encounter: Payer: Self-pay | Admitting: Gastroenterology

## 2015-05-15 ENCOUNTER — Telehealth: Payer: Self-pay | Admitting: Gastroenterology

## 2015-05-15 NOTE — Telephone Encounter (Signed)
Yes I was made aware of it. I don't know why Dr. Lynnda Shields name was listed as the referring provider for this patient. I spoke with [facility] staff and they are trouble shooting this. Can you please relay this to Remo Lipps so she is aware in case this has happened to other patients. Thanks

## 2015-05-15 NOTE — Telephone Encounter (Signed)
Left a message for Velva Harman to call back.

## 2015-05-15 NOTE — Telephone Encounter (Signed)
Rita with compliance wants to let Dr. Havery Moros know that the procedure report faxed to Dr. Rosalin Hawking at [facility] did not go to the correct place. Dr. Lavone Nian is not at the [facility] area the report was faxed too.

## 2015-05-16 NOTE — Telephone Encounter (Signed)
Greg Pace made aware.

## 2015-06-12 ENCOUNTER — Encounter: Payer: Self-pay | Admitting: *Deleted

## 2015-06-12 ENCOUNTER — Telehealth: Payer: Self-pay | Admitting: *Deleted

## 2015-06-12 NOTE — Telephone Encounter (Signed)
Pre-Visit Call completed with patient and chart updated.   Pre-Visit Info documented in Specialty Comments under [employer].    

## 2015-06-13 ENCOUNTER — Encounter: Payer: Self-pay | Admitting: Family Medicine

## 2015-06-13 ENCOUNTER — Ambulatory Visit (INDEPENDENT_AMBULATORY_CARE_PROVIDER_SITE_OTHER): Payer: BLUE CROSS/BLUE SHIELD | Admitting: Family Medicine

## 2015-06-13 VITALS — BP 132/84 | HR 58 | Temp 98.1°F | Ht 72.0 in | Wt 231.6 lb

## 2015-06-13 DIAGNOSIS — Z13220 Encounter for screening for lipoid disorders: Secondary | ICD-10-CM

## 2015-06-13 DIAGNOSIS — Z125 Encounter for screening for malignant neoplasm of prostate: Secondary | ICD-10-CM | POA: Diagnosis not present

## 2015-06-13 DIAGNOSIS — Z1159 Encounter for screening for other viral diseases: Secondary | ICD-10-CM

## 2015-06-13 DIAGNOSIS — E291 Testicular hypofunction: Secondary | ICD-10-CM

## 2015-06-13 DIAGNOSIS — Z130 Encounter for screening for diseases of the blood and blood-forming organs and certain disorders involving the immune mechanism: Secondary | ICD-10-CM | POA: Diagnosis not present

## 2015-06-13 DIAGNOSIS — Z131 Encounter for screening for diabetes mellitus: Secondary | ICD-10-CM | POA: Diagnosis not present

## 2015-06-13 DIAGNOSIS — R0789 Other chest pain: Secondary | ICD-10-CM | POA: Diagnosis not present

## 2015-06-13 LAB — LIPID PANEL
CHOL/HDL RATIO: 4
CHOLESTEROL: 203 mg/dL — AB (ref 0–200)
HDL: 48.4 mg/dL (ref >39.00)
LDL CALC: 126 mg/dL — AB (ref 0–99)
NonHDL: 154.37
TRIGLYCERIDES: 142 mg/dL (ref 0.0–149.0)
VLDL: 28.4 mg/dL (ref 0.0–40.0)

## 2015-06-13 LAB — CBC
HEMATOCRIT: 38.3 % — AB (ref 39.0–52.0)
HEMOGLOBIN: 12.7 g/dL — AB (ref 13.0–17.0)
MCHC: 33.1 g/dL (ref 30.0–36.0)
MCV: 88.2 fl (ref 78.0–100.0)
PLATELETS: 193 10*3/uL (ref 150.0–400.0)
RBC: 4.34 Mil/uL (ref 4.22–5.81)
RDW: 13.3 % (ref 11.5–15.5)
WBC: 7.5 10*3/uL (ref 4.0–10.5)

## 2015-06-13 LAB — COMPREHENSIVE METABOLIC PANEL
ALBUMIN: 4.2 g/dL (ref 3.5–5.2)
ALT: 16 U/L (ref 0–53)
AST: 18 U/L (ref 0–37)
Alkaline Phosphatase: 58 U/L (ref 39–117)
BUN: 12 mg/dL (ref 6–23)
CALCIUM: 9.3 mg/dL (ref 8.4–10.5)
CHLORIDE: 106 meq/L (ref 96–112)
CO2: 28 meq/L (ref 19–32)
CREATININE: 0.98 mg/dL (ref 0.40–1.50)
GFR: 85.15 mL/min (ref >60.00)
Glucose, Bld: 88 mg/dL (ref 70–99)
POTASSIUM: 4.7 meq/L (ref 3.5–5.1)
Sodium: 141 mEq/L (ref 135–145)
Total Bilirubin: 0.3 mg/dL (ref 0.2–1.2)
Total Protein: 6.5 g/dL (ref 6.0–8.3)

## 2015-06-13 LAB — HEMOGLOBIN A1C: HEMOGLOBIN A1C: 5.7 % (ref 4.6–6.5)

## 2015-06-13 LAB — PSA: PSA: 0.65 ng/mL (ref 0.10–4.00)

## 2015-06-13 LAB — HEPATITIS C ANTIBODY: HCV Ab: NEGATIVE

## 2015-06-13 NOTE — Patient Instructions (Addendum)
It was very nice to meet you today I will be in touch with your labs asap We will check your testosterone to see if this may be why you have noticed low energy and other symptoms.   Your EKG looks good today- I will refer you to cardiology however as I do think you may need a stress test to screen for heart disease.  If your chest tightness symptoms change or get worse in the meantime please seek care, and start taking a baby aspirin daily (81mg )

## 2015-06-13 NOTE — Progress Notes (Signed)
[place] at [facility] [address] [place], [place] [address] [phone]  Date:  06/13/2015   Name:  Greg Pace   DOB:  21-Apr-1960   MRN:  [id number]  PCP:  Lamar Blinks, MD    Chief Complaint: Establish Care   History of Present Illness:  Greg Pace is a 55 y.o. very pleasant male patient who presents with the following:  Here today to establish care with me. He has been seen by Dr. Larose Kells but this was about 5 years ago. He has not had a CPE in some time. Recent colonosocpy 3/17- 5 year follow-up for a polyp.   No recent CPE or labs.    He does not have to see doctors much.   He is married He had a posiitve depression screen- he does not feel like he is really depressed. However he does feel like he has lost interest in some things.  He can feel distracted and like he has a lot of thougts in his head He does note that he feels a little grumpier than he used to be.  He feels like he sees the glass half empty. He sleeps well, appetite is "too good."  He used to enjoy golf, hunting but feels like he does not have the same amount of interest any longer. He did get hearing aids this year.   He did have a shoulder strain a few months ago that has gotten better now. 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  They have 3 grown children, no grands as of yet.  1 child in [place], 2 in [place].  He has also noted some epiodes of a tight feeling in his chest over the last couple of years. Can occur every 2-3 months and may last for 2 hours. Usually occur at rest.  He does not have any known cardiac history  Patient Active Problem List   Diagnosis Date Noted  . FATIGUE 04/15/2010  . LIBIDO, DECREASED 04/15/2010  . THYROID NODULE 01/12/2007    Past Medical History  Diagnosis Date  . Heart murmur     age 89  . Thyroid disease     Past Surgical History  Procedure Laterality Date  . Colonoscopy       Social History  Substance Use Topics  . Smoking status: Never Smoker   . Smokeless tobacco: Never Used  . Alcohol Use: 0.0 oz/week    0 Standard drinks or equivalent per week     Comment: socially    Family History  Problem Relation Age of Onset  . Colon cancer Mother   . Colon polyps Neg Hx   . Depression Father     Allergies  Allergen Reactions  . Penicillins Rash    Medication list has been reviewed and updated.  Current Outpatient Prescriptions on File Prior to Visit  Medication Sig Dispense Refill  . ibuprofen (ADVIL,MOTRIN) 200 MG tablet Take 200 mg by mouth once as needed. For pain       No current facility-administered medications on file prior to visit.    Review of Systems:  As per HPI- otherwise negative.  Physical Examination: Filed Vitals:   06/13/15 0842  BP: 132/84  Pulse: 58  Temp: 98.1 F (36.7 C)   Filed Vitals:   06/13/15 0842  Height: 6' (1.829 m)  Weight: 231 lb 9.6 oz (105.053 kg)   Body mass index is 31.4 kg/(m^2).  Ideal Body Weight: Weight in (lb) to have BMI = 25: 183.9  GEN: WDWN, NAD, Non-toxic, A & O x 3, overweight, looks well HEENT: Atraumatic, Normocephalic. Neck supple. No masses, No LAD.  Bilateral TM wnl, oropharynx normal.  PEERL,EOMI.   Ears and Nose: No external deformity. CV: RRR, No M/G/R. No JVD. No thrill. No extra heart sounds. PULM: CTA B, no wheezes, crackles, rhonchi. No retractions. No resp. distress. No accessory muscle use. ABD: S, NT, ND EXTR: No c/c/e NEURO Normal gait.  PSYCH: Normally interactive. Conversant. Not depressed or anxious appearing.  Calm demeanor.   EKG: sinus bradycardia with rate of 52, ow normal  Assessment and Plan: Chest tightness - Plan: EKG 12-Lead, Ambulatory referral to Cardiology  Screening for hyperlipidemia - Plan: Lipid panel  Screening for prostate cancer - Plan: PSA  Screening for diabetes mellitus - Plan: Comprehensive metabolic panel, Hemoglobin  A1c  Hypogonadism in male - Plan: Testosterone Total,Free,Bio, Males  Screening for deficiency anemia - Plan: CBC  Need for hepatitis C screening test - Plan: Hepatitis C antibody  Await labs Colonoscopy and tetanus UTD Will refer to cardiology about chest tightness  It was very nice to meet you today I will be in touch with your labs asap We will check your testosterone to see if this may be why you have noticed low energy and other symptoms.   Your EKG looks good today- I will refer you to cardiology however as I do think you may need a stress test to screen for heart disease.  If your chest tightness symptoms change or get worse in the meantime please seek care, and start taking a baby aspirin daily (81mg )    Signed Lamar Blinks, MD

## 2015-06-14 ENCOUNTER — Ambulatory Visit (INDEPENDENT_AMBULATORY_CARE_PROVIDER_SITE_OTHER): Payer: BLUE CROSS/BLUE SHIELD | Admitting: Internal Medicine

## 2015-06-14 ENCOUNTER — Encounter: Payer: Self-pay | Admitting: Internal Medicine

## 2015-06-14 VITALS — BP 126/70 | HR 54 | Ht 72.0 in | Wt 229.0 lb

## 2015-06-14 DIAGNOSIS — R0789 Other chest pain: Secondary | ICD-10-CM | POA: Diagnosis not present

## 2015-06-14 DIAGNOSIS — E785 Hyperlipidemia, unspecified: Secondary | ICD-10-CM

## 2015-06-14 LAB — TESTOSTERONE TOTAL,FREE,BIO, MALES
Albumin: 4.3 g/dL (ref 3.6–5.1)
SEX HORMONE BINDING: 21 nmol/L (ref 10–50)
TESTOSTERONE BIOAVAILABLE: 98.3 ng/dL — AB (ref 130.5–681.7)
TESTOSTERONE FREE: 49.9 pg/mL (ref 47.0–244.0)
Testosterone: 277 ng/dL (ref 250–827)

## 2015-06-14 NOTE — Progress Notes (Signed)
Cardiology Office Note   Date:  06/14/2015   ID:  Greg Pace, DOB Jul 09, 1960, MRN [id number]  PCP:  Lamar Blinks, MD  Cardiologist:   Dorris Carnes, MD   Patient is referred by J Copland for Chest tightness     History of Present Illness: Greg Pace is a 55 y.o. male with a history of Chest tightness  Pt says spells happen 1 time every 1 to 2 months  Spels last about 1 to 2 hours  Goes away  Breathing may make it worse  Physical activity:   He walks  Stairs: 15 at home  At work he will climb 4 flights  By the top stair he says he feels a little winded, no change         Outpatient Prescriptions Prior to Visit  Medication Sig Dispense Refill  . ibuprofen (ADVIL,MOTRIN) 200 MG tablet Take 200 mg by mouth once as needed. For pain       No facility-administered medications prior to visit.     Allergies:   Penicillins   Past Medical History  Diagnosis Date  . Heart murmur     age 29  . Thyroid disease     Past Surgical History  Procedure Laterality Date  . Colonoscopy       Social History:  The patient  reports that he has never smoked. He has never used smokeless tobacco. He reports that he drinks alcohol. He reports that he does not use illicit drugs.   Family History:  The patient's family history includes Colon cancer in his mother; Depression in his father. There is no history of Colon polyps. Dad had aneurysm in head     ROS:  Please see the history of present illness. All other systems are reviewed and  Negative to the above problem except as noted.    PHYSICAL EXAM: VS:  BP 126/70 mmHg  Pulse 54  Ht 6' (1.829 m)  Wt 229 lb (103.874 kg)  BMI 31.05 kg/m2  SpO2 96%  GEN: Well nourished, well developed, in no acute distress HEENT: normal Neck: no JVD, carotid bruits, or masses Cardiac: RRR; no murmurs, rubs, or gallops,no edema  Respiratory:  clear to auscultation bilaterally, normal work of breathing GI: soft, nontender, nondistended, + BS   No hepatomegaly  MS: no deformity Moving all extremities   Skin: warm and dry, no rash Neuro:  Strength and sensation are intact Psych: euthymic mood, full affect   EKG:  EKG is ordered today.  SB 52 bpm  Nonspecifie    Lipid Panel    Component Value Date/Time   CHOL 203* 06/13/2015 0927   TRIG 142.0 06/13/2015 0927   HDL 48.40 06/13/2015 0927   CHOLHDL 4 06/13/2015 0927   VLDL 28.4 06/13/2015 0927   LDLCALC 126* 06/13/2015 0927   LDLDIRECT 118.6 07/15/2009 0000      Wt Readings from Last 3 Encounters:  06/14/15 229 lb (103.874 kg)  06/13/15 231 lb 9.6 oz (105.053 kg)  05/08/15 228 lb (103.42 kg)      ASSESSMENT AND PLAN:  1  Chest tightness  I do not think that the spells represent angina.  Only occasional  Not associated with activity  He can still climb 4 flights of stairs   ? If subclinical reflux triggering spasm of esophagus  ? Musculoskeletal  I encouraged him to stay active  I owuld not plan any further testing for now  2  HL  LDL in 120s  Discussed diet with low sat fats He said that he had vascular screens done at work  ? Mild plaquing  Will review  This will determine how aggressive to be with lipids    F/U based on review of records       Signed, Dorris Carnes, MD  06/14/2015 2:57 PM    [facility] [address], [place], [place]  [address] Phone: [phone]; Fax: [phone]

## 2015-06-16 ENCOUNTER — Encounter: Payer: Self-pay | Admitting: Family Medicine

## 2015-07-03 ENCOUNTER — Ambulatory Visit: Payer: BLUE CROSS/BLUE SHIELD | Admitting: Cardiology

## 2020-09-24 ENCOUNTER — Encounter: Payer: Self-pay | Admitting: Gastroenterology
